# Patient Record
Sex: Male | Born: 1980 | Hispanic: Yes | Marital: Single | Smoking: Never smoker
Health system: Southern US, Community
[De-identification: ages and names within clinical notes are randomized; demographics above are authoritative.]

## PROBLEM LIST (undated history)

## (undated) HISTORY — PX: APPENDECTOMY: SHX54

---

## 2020-12-29 ENCOUNTER — Encounter: Payer: Self-pay | Admitting: Emergency Medicine

## 2020-12-29 ENCOUNTER — Other Ambulatory Visit: Payer: Self-pay

## 2020-12-29 ENCOUNTER — Emergency Department
Admission: EM | Admit: 2020-12-29 | Discharge: 2020-12-30 | Disposition: A | Discharge status: Home / Self Care | Payer: Self-pay | Attending: Emergency Medicine | Admitting: Emergency Medicine

## 2020-12-29 DIAGNOSIS — H66012 Acute suppurative otitis media with spontaneous rupture of ear drum, left ear: Secondary | ICD-10-CM | POA: Insufficient documentation

## 2020-12-29 MED ORDER — AMOXICILLIN 875 MG PO TABS: 875.0000 mg | ORAL_TABLET | Freq: Two times a day (BID) | ORAL | 0 refills | Status: AC

## 2020-12-29 MED ORDER — OFLOXACIN 0.3 % OT SOLN: 5.0000 [drp] | Freq: Every day | OTIC | 0 refills | Status: AC

## 2020-12-29 NOTE — Discharge Instructions (Addendum)
You can take amoxicillin twice daily for the next 7 days. You can take 5 drops of ofloxacin twice daily for the next 7 days.

## 2020-12-29 NOTE — ED Provider Notes (Signed)
[facility]-EMERGENCY DEPARTMENT  ____________________________________________  Time seen: Approximately 10:34 PM  I have reviewed the triage vital signs and the nursing notes.   HISTORY  Chief Complaint Otalgia   Historian Patient     HPI Mark Savage is a 40 y.o. male presents to the emergency department with left ear pain and bloody discharge from the left ear.  He reports that hearing has been muffled for the past several weeks.  No alleviating measures have been attempted.   History reviewed. No pertinent past medical history.   Immunizations up to date:  Yes.     History reviewed. No pertinent past medical history.  There are no problems to display for this patient.   Past Surgical History:  Procedure Laterality Date   APPENDECTOMY      Prior to Admission medications   Medication Sig Start Date End Date Taking? Authorizing Provider  amoxicillin (AMOXIL) 875 MG tablet Take 1 tablet (875 mg total) by mouth 2 (two) times daily for 10 days. 12/29/20 01/08/21 Yes Pia Mau M, PA-C  ofloxacin (FLOXIN) 0.3 % OTIC solution Place 5 drops into both ears daily for 7 days. 12/29/20 01/05/21 Yes Orvil Feil, PA-C    Allergies Patient has no known allergies.  No family history on file.  Social History Social History   Tobacco Use   Smoking status: Never   Smokeless tobacco: Never  Vaping Use   Vaping Use: Never used  Substance Use Topics   Alcohol use: Yes     Review of Systems  Constitutional: No fever/chills Eyes:  No discharge ENT: Patient has left sided ear pain.  Respiratory: no cough. No SOB/ use of accessory muscles to breath Gastrointestinal:   No nausea, no vomiting.  No diarrhea.  No constipation. Musculoskeletal: Negative for musculoskeletal pain. Skin: Negative for rash, abrasions, lacerations, ecchymosis. ____________________________________________   PHYSICAL EXAM:  VITAL SIGNS: ED Triage Vitals  Enc Vitals Group     BP  12/29/20 1912 (!) 163/103     Pulse Rate 12/29/20 1912 63     Resp 12/29/20 1912 16     Temp 12/29/20 1912 98.8 F (37.1 C)     Temp Source 12/29/20 1912 Oral     SpO2 12/29/20 1912 98 %     Weight 12/29/20 1920 190 lb (86.2 kg)     Height 12/29/20 1920 5\' 5"  (1.651 m)     Head Circumference --      Peak Flow --      Pain Score 12/29/20 1919 10     Pain Loc --      Pain Edu? --      Excl. in GC? --      Constitutional: Alert and oriented. Well appearing and in no acute distress. Eyes: Conjunctivae are normal. PERRL. EOMI. Head: Atraumatic. ENT:      Ears: Patient's left TM has evidence of otitis media with perforation.      Nose: No congestion/rhinnorhea.      Mouth/Throat: Mucous membranes are moist.  Neck: No stridor.  No cervical spine tenderness to palpation. Cardiovascular: Normal rate, regular rhythm. Normal S1 and S2.  Good peripheral circulation. Respiratory: Normal respiratory effort without tachypnea or retractions. Lungs CTAB. Good air entry to the bases with no decreased or absent breath sounds Gastrointestinal: Bowel sounds x 4 quadrants. Soft and nontender to palpation. No guarding or rigidity. No distention. Musculoskeletal: Full range of motion to all extremities. No obvious deformities noted Neurologic:  Normal for age. No gross  focal neurologic deficits are appreciated.  Skin:  Skin is warm, dry and intact. No rash noted. Psychiatric: Mood and affect are normal for age. Speech and behavior are normal.   ____________________________________________   LABS (all labs ordered are listed, but only abnormal results are displayed)  Labs Reviewed - No data to display ____________________________________________  EKG   ____________________________________________  RADIOLOGY   No results found.  ____________________________________________    PROCEDURES  Procedure(s) performed:     Procedures     Medications - No data to  display   ____________________________________________   INITIAL IMPRESSION / ASSESSMENT AND PLAN / ED COURSE  Pertinent labs & imaging results that were available during my care of the patient were reviewed by me and considered in my medical decision making (see chart for details).      Assessment and plan Otitis media with perforation 40 year old male presents to the emergency department with left ear pain and bloody purulent discharge.  On exam, patient had perforated TM with evidence of otitis media.  Patient was discharged with amoxicillin and ofloxacin.  Tylenol was recommended for discomfort.  All patient questions were answered.     ____________________________________________  FINAL CLINICAL IMPRESSION(S) / ED DIAGNOSES  Final diagnoses:  Acute suppurative otitis media of left ear with spontaneous rupture of tympanic membrane, recurrence not specified      NEW MEDICATIONS STARTED DURING THIS VISIT:  ED Discharge Orders          Ordered    amoxicillin (AMOXIL) 875 MG tablet  2 times daily        12/29/20 2231    ofloxacin (FLOXIN) 0.3 % OTIC solution  Daily        12/29/20 2231                This chart was dictated using voice recognition software/Dragon. Despite best efforts to proofread, errors can occur which can change the meaning. Any change was purely unintentional.     Orvil Feil, PA-C 12/29/20 2236    Sharyn Creamer, MD 12/30/20 [id number]

## 2020-12-29 NOTE — ED Notes (Addendum)
Pt states that 3 months ago after return from the beach he cleaned his L ear with a qtip and started having difficulty hearing afterwards. Today, he started having blood from his L ear. Pt deneis pain, dizziness/n/v/fever. Pt has instilled hydrogen peroxide into ear after having hearing loss, after the qtip in his ear, once because he felt his ear pulsating. Pt has not sought any medical attention in the [place] Pt states he saw a doctor in [place]. Pt said the doctor told him not to bath in the ocean or river because "something burst in the ear".

## 2020-12-29 NOTE — ED Triage Notes (Signed)
Triage Assessment per SPanish interpreter  Pt arrived via POV with reports of L ear pain and decreased hearing x 2 months with bleeding noted today. Pt states he feels like there is something in his ear.

## 2021-03-07 ENCOUNTER — Emergency Department: Payer: Self-pay

## 2021-03-07 ENCOUNTER — Other Ambulatory Visit: Payer: Self-pay

## 2021-03-07 ENCOUNTER — Emergency Department
Admission: EM | Admit: 2021-03-07 | Discharge: 2021-03-07 | Disposition: A | Discharge status: Home / Self Care | Payer: Self-pay | Attending: Emergency Medicine | Admitting: Emergency Medicine

## 2021-03-07 DIAGNOSIS — R519 Headache, unspecified: Secondary | ICD-10-CM | POA: Insufficient documentation

## 2021-03-07 MED ORDER — BUTALBITAL-APAP-CAFFEINE 50-325-40 MG PO TABS: 1.0000 | ORAL_TABLET | Freq: Four times a day (QID) | ORAL | 0 refills | Status: AC | PRN

## 2021-03-07 MED ORDER — KETOROLAC TROMETHAMINE 30 MG/ML IJ SOLN
30.0000 mg | Freq: Once | INTRAMUSCULAR | Status: AC
  Administered 2021-03-07: 30 mg via INTRAMUSCULAR
  Filled 2021-03-07: qty 1

## 2021-03-07 NOTE — ED Notes (Signed)
Spanish interpreter on video.

## 2021-03-07 NOTE — ED Provider Notes (Signed)
[facility] Emergency Department Provider Note   ____________________________________________    I have reviewed the triage vital signs and the nursing notes.   HISTORY  Chief Complaint Headache     HPI Mark Savage is a 39 y.o. male who presents with complaints of headache.  Patient reports 3 weeks of headache typically in the morning.  He describes waking up with throbbing pain in his posterior head, then extends anteriorly bilaterally.  Denies sinus pressure.  No fevers or chills.  No neck pain.  No neurodeficits.  No history of the same.  Has taken ibuprofen for this  History reviewed. No pertinent past medical history.  There are no problems to display for this patient.   Past Surgical History:  Procedure Laterality Date   APPENDECTOMY      Prior to Admission medications   Medication Sig Start Date End Date Taking? Authorizing Provider  butalbital-acetaminophen-caffeine (FIORICET) 50-325-40 MG tablet Take 1-2 tablets by mouth every 6 (six) hours as needed for headache. 03/07/21 03/07/22 Yes Jene Every, MD     Allergies Patient has no known allergies.  No family history on file.  Social History Social History   Tobacco Use   Smoking status: Never   Smokeless tobacco: Never  Vaping Use   Vaping Use: Never used  Substance Use Topics   Alcohol use: Yes    Review of Systems  Constitutional: No fever/chills  ENT: No sore throat.   Gastrointestinal: No abdominal pain.  No nausea, no vomiting.   Genitourinary: Negative for dysuria. Musculoskeletal: Negative for back pain. Skin: Negative for rash. Neurological: As above    ____________________________________________   PHYSICAL EXAM:  VITAL SIGNS: ED Triage Vitals [03/07/21 0909]  Enc Vitals Group     BP (!) 161/91     Pulse Rate 65     Resp 18     Temp 98.3 F (36.8 C)     Temp Source Oral     SpO2 100 %     Weight 90.7 kg (200 lb)      Height 1.676 m (5\' 6" )     Head Circumference      Peak Flow      Pain Score 10     Pain Loc      Pain Edu?      Excl. in GC?     Constitutional: Alert and oriented. No acute distress. Pleasant and interactive Eyes: Conjunctivae are normal.  Head: Atraumatic. Nose: No congestion/rhinnorhea. Mouth/Throat: Mucous membranes are moist.   Cardiovascular: Normal rate, regular rhythm.  Respiratory: Normal respiratory effort.  No retractions. Genitourinary: deferred Musculoskeletal: No lower extremity tenderness nor edema.   Neurologic:  Normal speech and language. No gross focal neurologic deficits are appreciated.   Skin:  Skin is warm, dry and intact. No rash noted.   ____________________________________________   LABS (all labs ordered are listed, but only abnormal results are displayed)  Labs Reviewed - No data to display ____________________________________________  EKG   ____________________________________________  RADIOLOGY  CT head reviewed by me, no acute abnormality ____________________________________________   PROCEDURES  Procedure(s) performed: No  Procedures   Critical Care performed: No ____________________________________________   INITIAL IMPRESSION / ASSESSMENT AND PLAN / ED COURSE  Pertinent labs & imaging results that were available during my care of the patient were reviewed by me and considered in my medical decision making (see chart for details).   Patient presents with 3 weeks of morning headaches as described above.  We will send for imaging to rule out mass-effect.  CT is reassuring, patient feeling better after IM Toradol.  We will start Fioricet recommend the patient follow-up with neurology in [facility] where he is from.   ____________________________________________   FINAL CLINICAL IMPRESSION(S) / ED DIAGNOSES  Final diagnoses:  Acute nonintractable headache, unspecified headache type      NEW MEDICATIONS STARTED  DURING THIS VISIT:  Discharge Medication List as of 03/07/2021 11:17 AM     START taking these medications   Details  butalbital-acetaminophen-caffeine (FIORICET) 50-325-40 MG tablet Take 1-2 tablets by mouth every 6 (six) hours as needed for headache., Starting Sun 03/07/2021, Until Mon 03/07/2022 at 2359, Normal         Note:  This document was prepared using Dragon voice recognition software and may include unintentional dictation errors.    Jene Every, MD 03/07/21 1444

## 2021-03-07 NOTE — ED Notes (Signed)
Patient taken to CT scan.

## 2021-03-07 NOTE — ED Triage Notes (Signed)
Pt c/o headache to the back of his head that comes up around the front for the past 3 weeks, states he has been taking advil with no relief, last took 600mg  around 4am today, denies nausea or visual changes,

## 2021-04-10 ENCOUNTER — Encounter (HOSPITAL_BASED_OUTPATIENT_CLINIC_OR_DEPARTMENT_OTHER): Payer: Self-pay | Admitting: *Deleted

## 2021-04-10 ENCOUNTER — Other Ambulatory Visit: Payer: Self-pay

## 2021-04-10 ENCOUNTER — Emergency Department (HOSPITAL_BASED_OUTPATIENT_CLINIC_OR_DEPARTMENT_OTHER)
Admission: EM | Admit: 2021-04-10 | Discharge: 2021-04-10 | Disposition: A | Discharge status: Home / Self Care | Payer: Self-pay | Attending: Emergency Medicine | Admitting: Emergency Medicine

## 2021-04-10 DIAGNOSIS — H66005 Acute suppurative otitis media without spontaneous rupture of ear drum, recurrent, left ear: Secondary | ICD-10-CM | POA: Insufficient documentation

## 2021-04-10 DIAGNOSIS — G43009 Migraine without aura, not intractable, without status migrainosus: Secondary | ICD-10-CM | POA: Insufficient documentation

## 2021-04-10 MED ORDER — DIPHENHYDRAMINE HCL 50 MG/ML IJ SOLN
12.5000 mg | Freq: Once | INTRAMUSCULAR | Status: AC
  Administered 2021-04-10: 12.5 mg via INTRAVENOUS
  Filled 2021-04-10: qty 1

## 2021-04-10 MED ORDER — SODIUM CHLORIDE 0.9 % IV BOLUS
500.0000 mL | Freq: Once | INTRAVENOUS | Status: AC
  Administered 2021-04-10: 500 mL via INTRAVENOUS

## 2021-04-10 MED ORDER — KETOROLAC TROMETHAMINE 15 MG/ML IJ SOLN
15.0000 mg | Freq: Once | INTRAMUSCULAR | Status: AC
  Administered 2021-04-10: 15 mg via INTRAVENOUS
  Filled 2021-04-10: qty 1

## 2021-04-10 MED ORDER — AMOXICILLIN-POT CLAVULANATE 875-125 MG PO TABS: 1.0000 | ORAL_TABLET | Freq: Two times a day (BID) | ORAL | 0 refills | Status: DC

## 2021-04-10 NOTE — Discharge Instructions (Addendum)
Lo vieron en el departamento de emergencias por dolor de odo.   Como comentamos, tiene una infeccin en el odo izquierdo para la que le estoy recetando antibiticos. Debe tomar [employer] al da durante una semana. Creo que su dolor de [place] y mareos mejorarn cuando esto sea tratado. Puede tomar ibuprofeno o tylenol para el dolor.   Regrese al departamento de emergencias por fiebre, dificultad para tragar o [employer].

## 2021-04-10 NOTE — ED Provider Notes (Signed)
[facility] EMERGENCY DEPARTMENT Provider Note   CSN: [id number] Arrival date & time: 04/10/21  1947     History Chief Complaint  Patient presents with   Otalgia    Mark Savage is a 40 y.o. male with history of migraines who presents to the emergency department complaining of headache for several weeks, and left ear pain.  Patient states that he has had bloody drainage from his left ear, and this happened to him before when he had an ear infection.  He has been taking migraine medication daily without relief.  He denies fevers, chills, sore throat, changes in his vision.  Spanish interpreter used during interview.   Otalgia Associated symptoms: headaches   Associated symptoms: no abdominal pain, no congestion, no fever, no sore throat and no vomiting       History reviewed. No pertinent past medical history.  There are no problems to display for this patient.   Past Surgical History:  Procedure Laterality Date   APPENDECTOMY         No family history on file.  Social History   Tobacco Use   Smoking status: Never   Smokeless tobacco: Never  Vaping Use   Vaping Use: Never used  Substance Use Topics   Alcohol use: Yes   Drug use: Never    Home Medications Prior to Admission medications   Medication Sig Start Date End Date Taking? Authorizing Provider  amoxicillin-clavulanate (AUGMENTIN) 875-125 MG tablet Take 1 tablet by mouth every 12 (twelve) hours. 04/10/21  Yes Ayslin Kundert T, PA-C  butalbital-acetaminophen-caffeine (FIORICET) 50-325-40 MG tablet Take 1-2 tablets by mouth every 6 (six) hours as needed for headache. 03/07/21 03/07/22  Jene Every, MD    Allergies    Patient has no known allergies.  Review of Systems   Review of Systems  Constitutional:  Negative for chills and fever.  HENT:  Positive for ear pain. Negative for congestion, sore throat and trouble swallowing.   Respiratory:  Negative for shortness of  breath.   Cardiovascular:  Negative for chest pain.  Gastrointestinal:  Negative for abdominal pain, nausea and vomiting.  Neurological:  Positive for dizziness and headaches. Negative for syncope and numbness.  All other systems reviewed and are negative.  Physical Exam Updated Vital Signs BP (!) 143/88 (BP Location: Left Arm)    Pulse 65    Temp 98.4 F (36.9 C) (Oral)    Resp 16    Ht 5\' 6"  (1.676 m)    Wt 88 kg    SpO2 99%    BMI 31.31 kg/m   Physical Exam Vitals and nursing note reviewed.  Constitutional:      Appearance: Normal appearance.  HENT:     Head: Normocephalic and atraumatic.     Right Ear: Tympanic membrane, ear canal and external ear normal.     Left Ear: Ear canal and external ear normal. Tenderness present. No mastoid tenderness. Tympanic membrane is erythematous and bulging. Tympanic membrane is not perforated.     Ears:     Comments: Bulging TM with purulent effusion Eyes:     Conjunctiva/sclera: Conjunctivae normal.  Cardiovascular:     Rate and Rhythm: Normal rate and regular rhythm.  Pulmonary:     Effort: Pulmonary effort is normal. No respiratory distress.     Breath sounds: Normal breath sounds.  Abdominal:     General: There is no distension.     Palpations: Abdomen is soft.     Tenderness: There  is no abdominal tenderness.  Skin:    General: Skin is warm and dry.  Neurological:     General: No focal deficit present.     Mental Status: He is alert.     Comments: Neuro: Speech is clear, able to follow commands. CN III-XII intact grossly intact. PERRLA. EOMI. Sensation intact throughout. Str 5/5 all extremities.     ED Results / Procedures / Treatments   Labs (all labs ordered are listed, but only abnormal results are displayed) Labs Reviewed - No data to display  EKG None  Radiology No results found.  Procedures Procedures   Medications Ordered in ED Medications  sodium chloride 0.9 % bolus 500 mL (500 mLs Intravenous New Bag/Given  04/10/21 2033)  ketorolac (TORADOL) 15 MG/ML injection 15 mg (15 mg Intravenous Given 04/10/21 2034)  diphenhydrAMINE (BENADRYL) injection 12.5 mg (12.5 mg Intravenous Given 04/10/21 2033)    ED Course  I have reviewed the triage vital signs and the nursing notes.  Pertinent labs & imaging results that were available during my care of the patient were reviewed by me and considered in my medical decision making (see chart for details).    MDM Rules/Calculators/A&P                          Patient is a 40 year old male with history of migraines who presents to the emergency department for ear pain and headache.  History of recurrent ear infection.  On my exam patient is afebrile, not tachycardic, not hypoxic, no acute distress.  He has a bulging left TM with purulent effusion, no perforation. Neurologic exam grossly normal as above.  Patient states headache improved with migraine cocktail.  He is not requiring admission or inpatient treatment. Plan to discharge home with antibiotics, given close return precautions.  Patient agreeable to plan.  Final Clinical Impression(s) / ED Diagnoses Final diagnoses:  Recurrent acute suppurative otitis media without spontaneous rupture of left tympanic membrane  Migraine without aura and without status migrainosus, not intractable    Rx / DC Orders ED Discharge Orders          Ordered    amoxicillin-clavulanate (AUGMENTIN) 875-125 MG tablet  Every 12 hours        04/10/21 2140           Portions of this report may have been transcribed using voice recognition software. Every effort was made to ensure accuracy; however, inadvertent computerized transcription errors may be present.    Su Monks, PA-C 04/10/21 2142    Melene Plan, DO 04/10/21 2236

## 2021-04-10 NOTE — ED Triage Notes (Signed)
Pt reports left ear pain and pain in back of head x 2 months

## 2021-06-05 ENCOUNTER — Other Ambulatory Visit: Payer: Self-pay

## 2021-06-05 ENCOUNTER — Emergency Department (HOSPITAL_BASED_OUTPATIENT_CLINIC_OR_DEPARTMENT_OTHER)
Admission: EM | Admit: 2021-06-05 | Discharge: 2021-06-05 | Disposition: A | Discharge status: Home / Self Care | Payer: Self-pay | Attending: Emergency Medicine | Admitting: Emergency Medicine

## 2021-06-05 ENCOUNTER — Encounter (HOSPITAL_BASED_OUTPATIENT_CLINIC_OR_DEPARTMENT_OTHER): Payer: Self-pay | Admitting: Emergency Medicine

## 2021-06-05 DIAGNOSIS — H66015 Acute suppurative otitis media with spontaneous rupture of ear drum, recurrent, left ear: Secondary | ICD-10-CM | POA: Insufficient documentation

## 2021-06-05 DIAGNOSIS — H7292 Unspecified perforation of tympanic membrane, left ear: Secondary | ICD-10-CM

## 2021-06-05 MED ORDER — AMOXICILLIN-POT CLAVULANATE 875-125 MG PO TABS: 1.0000 | ORAL_TABLET | Freq: Two times a day (BID) | ORAL | 0 refills | Status: DC

## 2021-06-05 NOTE — ED Notes (Signed)
Discharge instructions including prescription and follow up care discussed with pt. Pt verbalized understanding with no questions at this time.  

## 2021-06-05 NOTE — ED Triage Notes (Signed)
Reports pain in left ear for the last few months and now having drainage.

## 2021-06-05 NOTE — ED Notes (Signed)
ED Provider at bedside. 

## 2021-06-05 NOTE — ED Provider Notes (Signed)
[facility] EMERGENCY DEPARTMENT Provider Note   CSN: [id number] Arrival date & time: 06/05/21  1911     History  Chief Complaint  Patient presents with   Otalgia    Mark Savage is a 41 y.o. male.  Pt is a well appearing 41 y/o male with hx of left ear pain and otitis who has returned due to chronic drianage from the ear but now 4 days of pain.  Pt reports for the last 3 months almost every day he will have a small amount of drainage from his left ear but feels his hearing is fine.  However now there is getting to be more pain in the ear in the last 4 days.  No pain behind the ear or swelling of the ear.  He denies any trauma but reports when he was a kid he would swim a lot and one time remembers perforating his ear drum.  He has been seen a few times in the ER and reports getting some pills that helped with the pain but the drainage never resolved.   The history is provided by the patient and medical records.  Otalgia Location:  Left     Home Medications Prior to Admission medications   Medication Sig Start Date End Date Taking? Authorizing Provider  amoxicillin-clavulanate (AUGMENTIN) 875-125 MG tablet Take 1 tablet by mouth every 12 (twelve) hours. 06/05/21  Yes Margarita Croke, Alphonzo Lemmings, MD  butalbital-acetaminophen-caffeine (FIORICET) 50-325-40 MG tablet Take 1-2 tablets by mouth every 6 (six) hours as needed for headache. 03/07/21 03/07/22  Jene Every, MD      Allergies    Patient has no known allergies.    Review of Systems   Review of Systems  HENT:  Positive for ear pain.    Physical Exam Updated Vital Signs BP (!) 150/104 (BP Location: Right Arm)    Pulse (!) 59    Temp 97.9 F (36.6 C) (Oral)    Resp 18    Ht 5\' 6"  (1.676 m)    Wt 88.3 kg    SpO2 100%    BMI 31.41 kg/m  Physical Exam Vitals and nursing note reviewed.  Constitutional:      Appearance: Normal appearance. He is normal weight.  HENT:     Head: Normocephalic and  atraumatic.     Right Ear: There is impacted cerumen.     Left Ear: No swelling. There is no impacted cerumen. No mastoid tenderness. Tympanic membrane is injected and perforated.     Ears:     Comments: Large central perforation of the TM with surrounding erythema and minimal clear drainage.  Normal canal.  No tenderness with movement of the pinna. Neurological:     Mental Status: He is alert.    ED Results / Procedures / Treatments   Labs (all labs ordered are listed, but only abnormal results are displayed) Labs Reviewed - No data to display  EKG None  Radiology No results found.  Procedures Procedures    Medications Ordered in ED Medications - No data to display  ED Course/ Medical Decision Making/ A&P                           Medical Decision Making Risk Prescription drug management.   Pt presenting with left ear pain.  Has had chronic drainage and today noted to have a perforated TM which I suspect has been chronic based on hx.  He has no  signs of mastoiditis and is well appearing.  Suspect early otitis given now pt has had pain and has some erythema and heavier drainage per pt.  Given augmentin but discussed with pt the importance of ENT f/u.  Information given as pt may need a patch to fix in the future.        Final Clinical Impression(s) / ED Diagnoses Final diagnoses:  Recurrent acute suppurative otitis media with spontaneous rupture of left tympanic membrane  Perforation of left tympanic membrane    Rx / DC Orders ED Discharge Orders          Ordered    amoxicillin-clavulanate (AUGMENTIN) 875-125 MG tablet  Every 12 hours        06/05/21 1931              Gwyneth Sprout, MD 06/05/21 1938

## 2022-07-22 ENCOUNTER — Other Ambulatory Visit: Payer: Self-pay

## 2022-07-22 ENCOUNTER — Emergency Department (HOSPITAL_BASED_OUTPATIENT_CLINIC_OR_DEPARTMENT_OTHER)
Admission: EM | Admit: 2022-07-22 | Discharge: 2022-07-22 | Disposition: A | Discharge status: Home / Self Care | Payer: Self-pay | Attending: Emergency Medicine | Admitting: Emergency Medicine

## 2022-07-22 DIAGNOSIS — R519 Headache, unspecified: Secondary | ICD-10-CM | POA: Insufficient documentation

## 2022-07-22 DIAGNOSIS — H9192 Unspecified hearing loss, left ear: Secondary | ICD-10-CM | POA: Insufficient documentation

## 2022-07-22 DIAGNOSIS — H9202 Otalgia, left ear: Secondary | ICD-10-CM | POA: Insufficient documentation

## 2022-07-22 MED ORDER — OFLOXACIN 0.3 % OP SOLN
5.0000 [drp] | Freq: Every day | OPHTHALMIC | Status: DC
  Administered 2022-07-22: 5 [drp] via OTIC
  Filled 2022-07-22: qty 5

## 2022-07-22 NOTE — ED Triage Notes (Signed)
Patient coming to ED for evaluation of L ear pain.  Reports decreased hearing and pain that radiates into head.  No reports of fever.  States symptoms started last week.

## 2022-07-22 NOTE — Discharge Instructions (Signed)
Please read and follow all provided instructions.  Your diagnoses today include:  1. Left ear pain   2. Decreased hearing of left ear   3. Acute nonintractable headache, unspecified headache type     Tests performed today include: Vital signs. See below for your results today.   Medications prescribed:  Ofloxacin (ear drops) - Use 5 drops in left ear twice a day for 7 days  Home care instructions:  Follow any educational materials contained in this packet.  Follow-up instructions: Call the ear doctor listed on Monday to schedule a follow-up appointment if your symptoms are not resolved.  Return instructions:  Please return to the Emergency Department if you experience worsening symptoms.  Please return if you have any other emergent concerns.  Additional Information:  Your vital signs today were: BP (!) 156/96 (BP Location: Left Arm)   Pulse 66   Temp 98.4 F (36.9 C) (Oral)   Resp 18   Ht 5\' 6"  (1.676 m)   Wt 88.5 kg   SpO2 99%   BMI 31.47 kg/m  If your blood pressure (BP) was elevated above 135/85 this visit, please have this repeated by your doctor within one month. ---------------

## 2022-07-22 NOTE — ED Provider Notes (Signed)
[place] [facility] Provider Note   CSN: [id number] Arrival date & time: 07/22/22  2011     History  Chief Complaint  Patient presents with   Otalgia   Headache    Mark Savage is a 42 y.o. male.  Patient presents for decreased hearing out of left ear as well as left ear pain and headache.  He has had headache pain for several days but the loss of hearing occurred yesterday.  Spanish interpreter utilized.  Patient has a history of an episode remotely while living in [place] where he states that he had a "popping" sensation in his ear followed by bleeding from his ear.  He states that he was given a shot which stopped the bleeding.  Otherwise no history regarding the ear.  No recent trauma.  Patient was at his job, moving furniture, yesterday when the symptoms began.  No fevers.  He has had a mild sore throat and mild nasal congestion.  Denies putting any foreign bodies into the ear canal.  Denies being hit on the head.  No current drainage or bleeding.       Home Medications Prior to Admission medications   Medication Sig Start Date End Date Taking? Authorizing Provider  amoxicillin-clavulanate (AUGMENTIN) 875-125 MG tablet Take 1 tablet by mouth every 12 (twelve) hours. 06/05/21   Blanchie Dessert, MD      Allergies    Patient has no known allergies.    Review of Systems   Review of Systems  Physical Exam Updated Vital Signs BP 134/86   Pulse 65   Temp 98.4 F (36.9 C) (Oral)   Resp 16   Ht 5\' 6"  (1.676 m)   Wt 88.5 kg   SpO2 97%   BMI 31.47 kg/m  Physical Exam Vitals and nursing note reviewed.  Constitutional:      Appearance: He is well-developed.  HENT:     Head: Normocephalic and atraumatic.     Jaw: No trismus.     Right Ear: There is impacted cerumen.     Left Ear: Decreased hearing noted. Swelling present. Tympanic membrane is not injected or erythematous.     Ears:     Comments: Left ear canal is  mildly edematous and there is some debris.  TM appears abnormal but not erythematous or bulging.  It is difficult for me to tell if there is a perforation.  I do not see typical landmarks.  Right ear with cerumen impaction but patient is asymptomatic on this side.    Nose: Nose normal. No mucosal edema or rhinorrhea.     Mouth/Throat:     Mouth: Mucous membranes are moist. Mucous membranes are not dry.     Pharynx: Uvula midline. No oropharyngeal exudate, posterior oropharyngeal erythema or uvula swelling.     Tonsils: No tonsillar abscesses.  Eyes:     General:        Right eye: No discharge.        Left eye: No discharge.     Conjunctiva/sclera: Conjunctivae normal.  Cardiovascular:     Rate and Rhythm: Normal rate and regular rhythm.     Heart sounds: Normal heart sounds.  Pulmonary:     Effort: Pulmonary effort is normal. No respiratory distress.     Breath sounds: Normal breath sounds. No wheezing or rales.  Abdominal:     Palpations: Abdomen is soft.     Tenderness: There is no abdominal tenderness.  Musculoskeletal:  Cervical back: Normal range of motion and neck supple.  Skin:    General: Skin is warm and dry.  Neurological:     Mental Status: He is alert.     ED Results / Procedures / Treatments   Labs (all labs ordered are listed, but only abnormal results are displayed) Labs Reviewed - No data to display  EKG None  Radiology No results found.  Procedures Procedures    Medications Ordered in ED Medications  ofloxacin (OCUFLOX) 0.3 % ophthalmic solution 5 drop (5 drops Left EAR Given 07/22/22 2113)    ED Course/ Medical Decision Making/ A&P   {    Patient seen and examined. History obtained directly from patient using Spanish video interpreter.  Labs/EKG: None ordered  Imaging: None ordered  Medications/Fluids: Ordered: Ofloxacin drops  Most recent vital signs reviewed and are as follows: BP 134/86   Pulse 65   Temp 98.4 F (36.9 C) (Oral)    Resp 16   Ht 5\' 6"  (1.676 m)   Wt 88.5 kg   SpO2 97%   BMI 31.47 kg/m   Initial impression: Ear pain and decreased hearing  Home treatment plan: Patient will trial eardrops over the weekend to see if this helps.  Return instructions discussed with patient: Worsening symptoms, fever, redness or swelling around the ear, other concerns.  Follow-up instructions discussed with patient: If symptoms are not improved in 3 days, patient is given ENT referral so that he can be further evaluated.                            Medical Decision Making Risk Prescription drug management.   Patient with decreased hearing in left ear as well as ear pain and headache.  No obvious otitis media.  TM appears abnormal but I do not distinctly see any trauma.  Ear canal exam suggestive of otitis externa but no active drainage.  No signs of malignant otitis externa. Treatment plan as above.  No indications for emergent ENT evaluation tonight.  In regards to the patient's headache, critical differentials were considered including subarachnoid hemorrhage, intracerebral hemorrhage, epidural/subdural hematoma, pituitary apoplexy, vertebral/carotid artery dissection, giant cell arteritis, central venous thrombosis, reversible cerebral vasoconstriction, acute angle closure glaucoma, idiopathic intracranial hypertension, bacterial meningitis, viral encephalitis, carbon monoxide poisoning, posterior reversible encephalopathy syndrome, pre-eclampsia.   Reg flag symptoms related to these causes were considered including systemic symptoms (fever, weight loss), neurologic symptoms (confusion, mental status change, vision change, associated seizure), acute or sudden "thunderclap" onset, patient age 43 or older with new or progressive headache, patient of any age with first headache or change in headache pattern, pregnant or postpartum status, history of HIV or other immunocompromise, history of cancer, headache occurring with  exertion, associated neck or shoulder pain, associated traumatic injury, concurrent use of anticoagulation, family history of spontaneous SAH, and concurrent drug use.    Other benign, more common causes of headache were considered including migraine, tension-type headache, cluster headache, referred pain from other cause such as sinus infection, dental pain, trigeminal neuralgia.          Final Clinical Impression(s) / ED Diagnoses Final diagnoses:  Left ear pain  Decreased hearing of left ear  Acute nonintractable headache, unspecified headache type    Rx / DC Orders ED Discharge Orders     None         Carlisle Cater, PA-C 07/22/22 2152    Leanord Asal K, DO 07/22/22 2302

## 2023-01-18 IMAGING — CT CT HEAD W/O CM
4 series · 16 of 47 positions shown, 18 images · non-contrast
Comparison: None.

CLINICAL DATA: Headaches

EXAM:
CT HEAD WITHOUT CONTRAST
TECHNIQUE: Contiguous axial images were obtained from the base of the skull
through the vertex without intravenous contrast.

[Series 2: head bone · axial · 0.40mm/px · z∈[-52,-24]mm · 3 of 73 slices shown]
[im 8/73  bone]
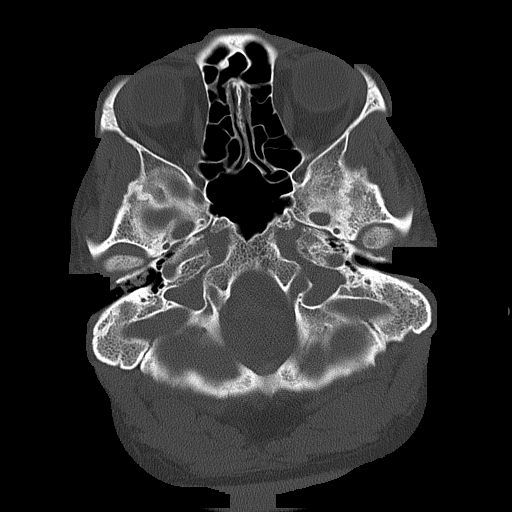
[im 15/73  bone]
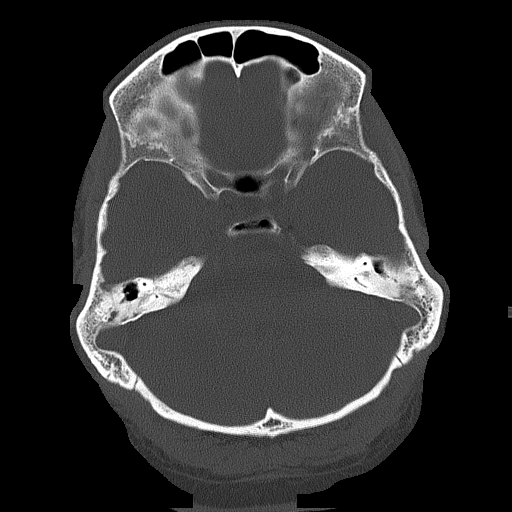
[im 22/73  bone]
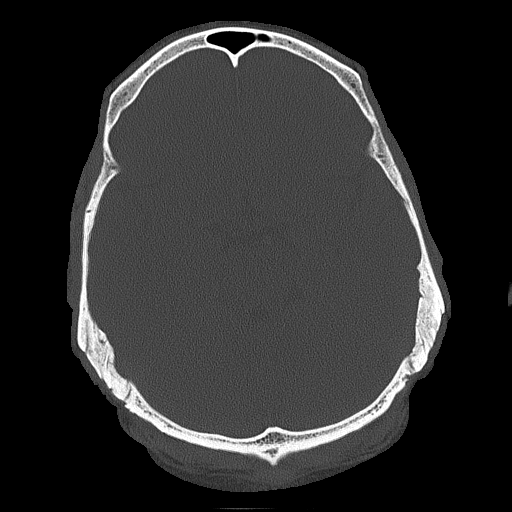

[Series 3: head wo · axial · 0.40mm/px · z∈[-51,+54]mm · 7 of 29 slices shown, 9 images]
[im 4/29  brain]
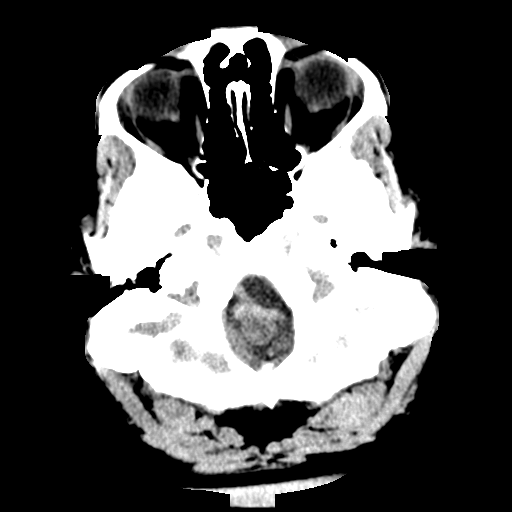
[im 4/29  bone]
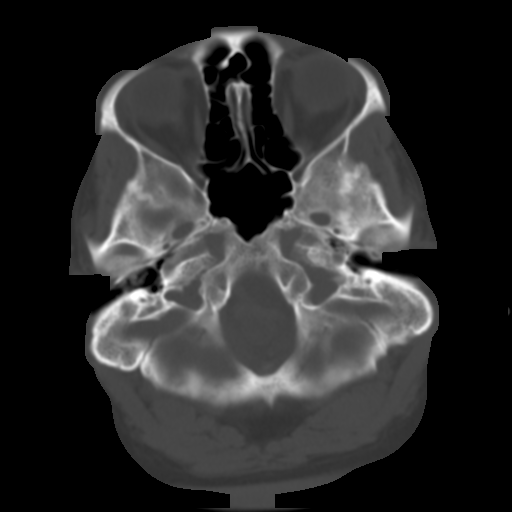
[im 8/29  brain]
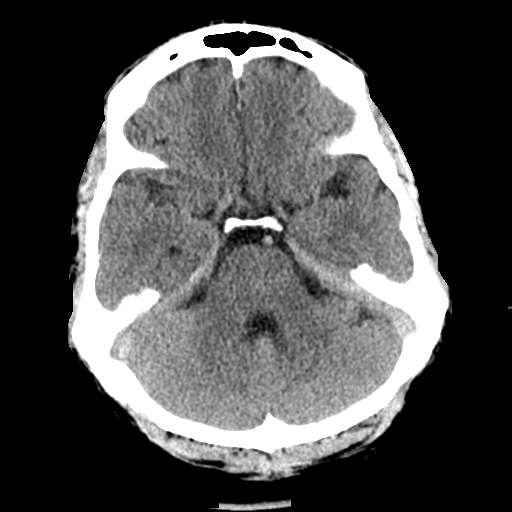
[im 11/29  brain]
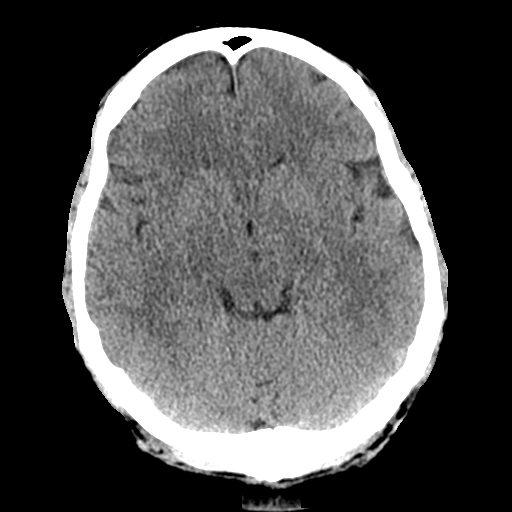
[im 15/29  brain]
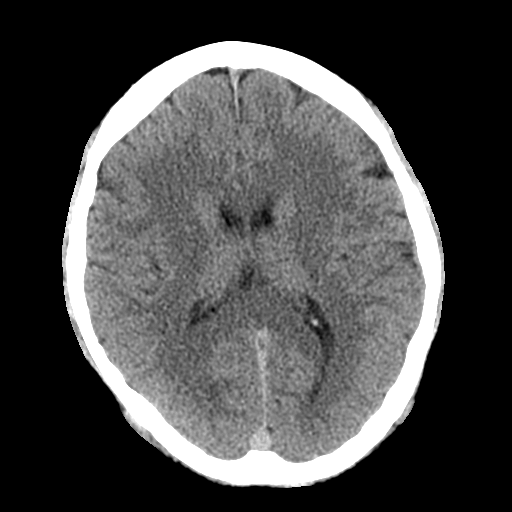
[im 18/29  brain]
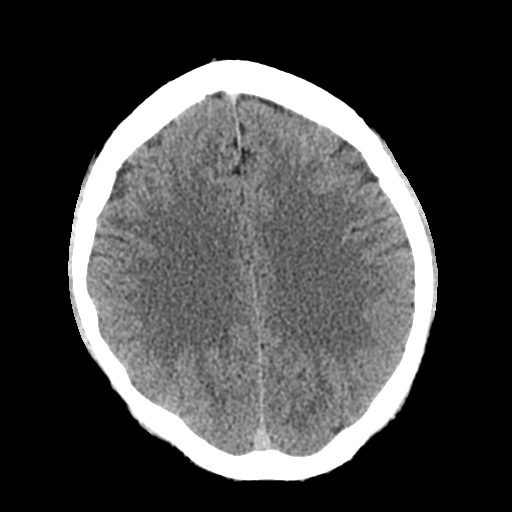
[im 18/29  bone]
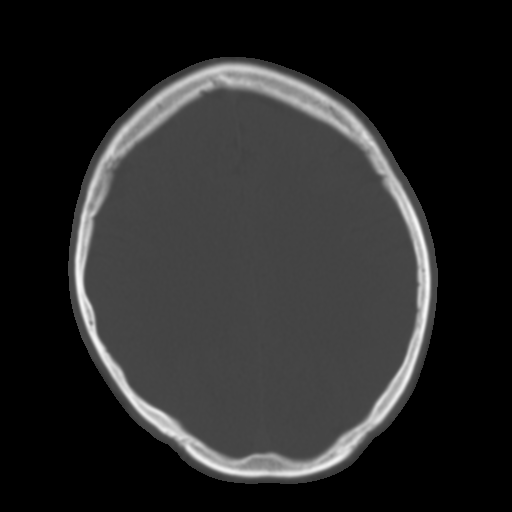
[im 22/29  brain]
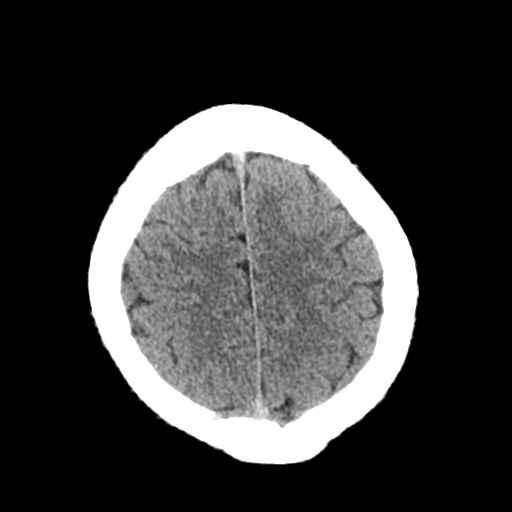
[im 25/29  brain]
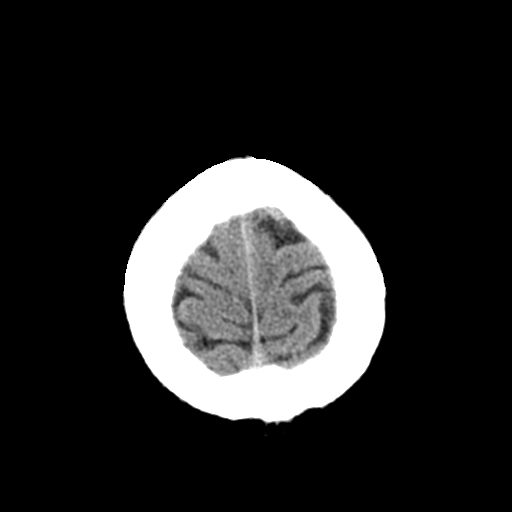

[Series 4: coronal soft tissue · coronal · 0.31mm/px · 3 of 67 slices shown]
[im 23/67  brain]
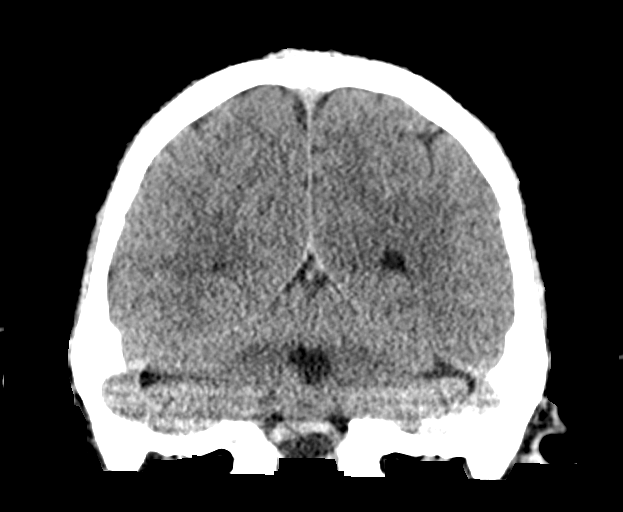
[im 30/67  brain]
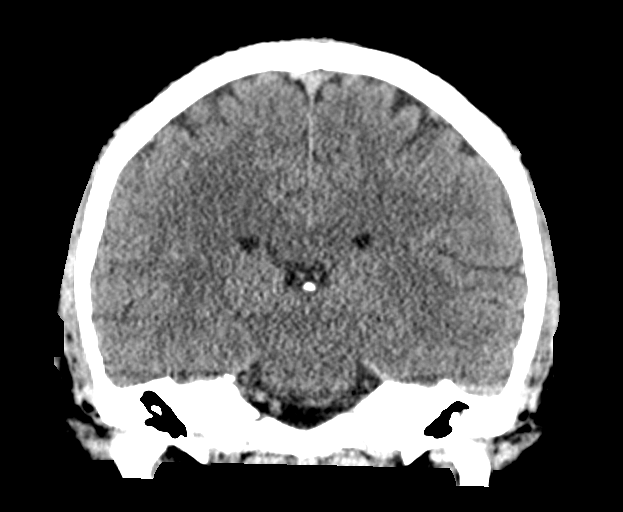
[im 37/67  brain]
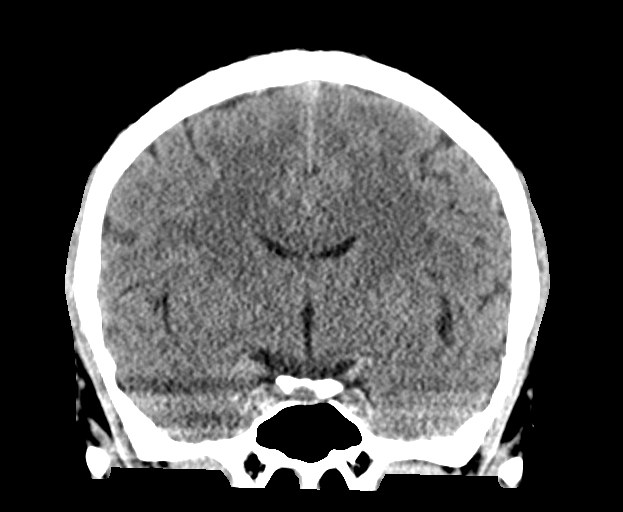

[Series 5: sagittal soft tissue · sagittal · 0.31mm/px · 3 of 58 slices shown]
[im 20/58  brain]
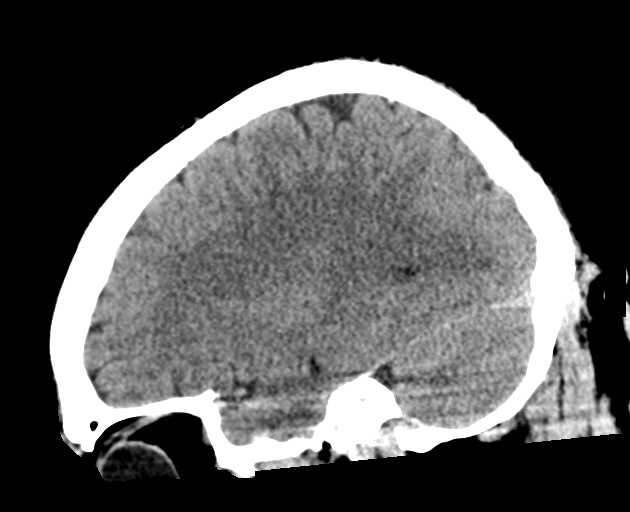
[im 29/58  brain]
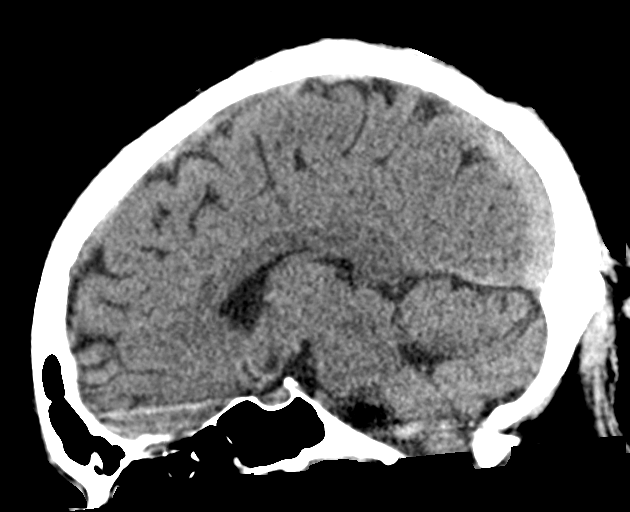
[im 39/58  brain]
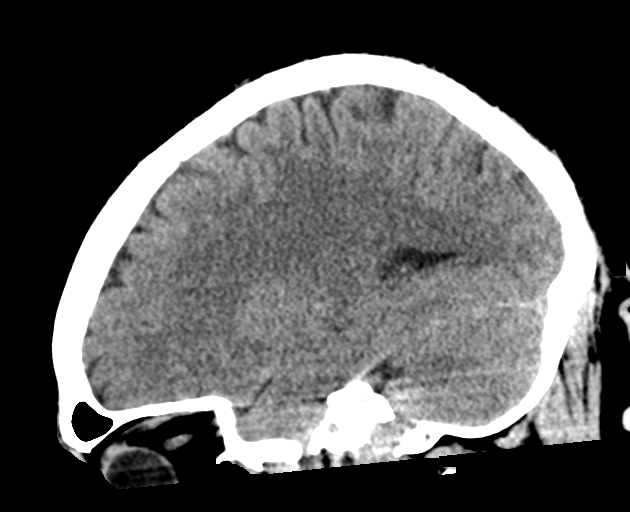

[16 of 47 positions shown; findings below may reference images not displayed]

FINDINGS: Brain: There are no signs of recent bleeding within the cranium.
There is no focal mass effect. Ventricles are not dilated. There are
no extra-axial fluid collections.

Vascular: Unremarkable.

Skull: Unremarkable.

Sinuses/Orbits: There are no air-fluid levels or significant mucosal
thickening in the visualized paranasal sinuses. There is decreased
number of air cells in mastoids on both sides. This may be a
congenital variation or suggest chronic mastoiditis.

Other: None
IMPRESSION: There are no signs of bleeding within the cranium. No acute
intracranial findings are seen.

## 2023-03-01 ENCOUNTER — Emergency Department (HOSPITAL_BASED_OUTPATIENT_CLINIC_OR_DEPARTMENT_OTHER)
Admission: EM | Admit: 2023-03-01 | Discharge: 2023-03-01 | Disposition: A | Discharge status: Home / Self Care | Payer: Self-pay | Attending: Emergency Medicine | Admitting: Emergency Medicine

## 2023-03-01 ENCOUNTER — Encounter (HOSPITAL_BASED_OUTPATIENT_CLINIC_OR_DEPARTMENT_OTHER): Payer: Self-pay | Admitting: Emergency Medicine

## 2023-03-01 ENCOUNTER — Other Ambulatory Visit: Payer: Self-pay

## 2023-03-01 DIAGNOSIS — M545 Low back pain, unspecified: Secondary | ICD-10-CM | POA: Insufficient documentation

## 2023-03-01 DIAGNOSIS — R519 Headache, unspecified: Secondary | ICD-10-CM | POA: Insufficient documentation

## 2023-03-01 DIAGNOSIS — M546 Pain in thoracic spine: Secondary | ICD-10-CM | POA: Insufficient documentation

## 2023-03-01 DIAGNOSIS — G8929 Other chronic pain: Secondary | ICD-10-CM | POA: Insufficient documentation

## 2023-03-01 MED ORDER — DICLOFENAC SODIUM 50 MG PO TBEC: 50.0000 mg | DELAYED_RELEASE_TABLET | Freq: Two times a day (BID) | ORAL | 0 refills | Status: AC

## 2023-03-01 MED ORDER — METHOCARBAMOL 500 MG PO TABS: 500.0000 mg | ORAL_TABLET | Freq: Two times a day (BID) | ORAL | 0 refills | Status: AC

## 2023-03-01 NOTE — ED Provider Notes (Signed)
[place] [facility] Provider Note   CSN: [id number] Arrival date & time: 03/01/23  1030     History  Chief Complaint  Patient presents with   Back Pain    Mark Savage Burman Bruington is a 42 y.o. male.  42 year old male presents with complaint of pain in his back and headaches.  He states he has a history of migraines, diagnosed when he was living in [place] 12 years ago, gets frequent generalized headaches.  He has been taking Excedrin for his headaches and states this helps with the pain for 2 or 3 hours and then the headaches resolved.  He denies recent falls or injuries, fevers.  Also reports generalized back pain ongoing for 3 to 6 months, daily, worse with movement, without fall or injury.  Patient has not been to PCP for these symptoms, states that he was off work today so he came to the ER.  No other complaints or concerns today.  A language interpreter was used Best boy, [place]).  Back Pain      Home Medications Prior to Admission medications   Medication Sig Start Date End Date Taking? Authorizing Provider  diclofenac (VOLTAREN) 50 MG EC tablet Take 1 tablet (50 mg total) by mouth 2 (two) times daily for 10 days. 03/01/23 03/11/23 Yes Jeannie Fend, PA-C  methocarbamol (ROBAXIN) 500 MG tablet Take 1 tablet (500 mg total) by mouth 2 (two) times daily. 03/01/23  Yes Jeannie Fend, PA-C      Allergies    Patient has no known allergies.    Review of Systems   Review of Systems  Musculoskeletal:  Positive for back pain.   Negative except as per HPI Physical Exam Updated Vital Signs BP (!) 150/98   Pulse 86   Temp 97.7 F (36.5 C) (Oral)   Resp 16   Wt 83.9 kg   SpO2 99%   BMI 29.86 kg/m  Physical Exam Vitals and nursing note reviewed.  Constitutional:      General: He is not in acute distress.    Appearance: He is well-developed. He is not diaphoretic.  HENT:     Head: Normocephalic and atraumatic.  Eyes:      Extraocular Movements: Extraocular movements intact.     Pupils: Pupils are equal, round, and reactive to light.  Cardiovascular:     Pulses: Normal pulses.  Pulmonary:     Effort: Pulmonary effort is normal.  Musculoskeletal:     Cervical back: Tenderness present. No bony tenderness.     Thoracic back: Tenderness present. No bony tenderness.     Lumbar back: Tenderness present. No bony tenderness.       Back:  Skin:    General: Skin is warm and dry.  Neurological:     Mental Status: He is alert and oriented to person, place, and time.     Motor: No weakness.     Gait: Gait normal.  Psychiatric:        Behavior: Behavior normal.     ED Results / Procedures / Treatments   Labs (all labs ordered are listed, but only abnormal results are displayed) Labs Reviewed - No data to display  EKG None  Radiology No results found.  Procedures Procedures    Medications Ordered in ED Medications - No data to display  ED Course/ Medical Decision Making/ A&P  Medical Decision Making  42 year old male presents with complaint of back pain for 3 to 6 months without injury.  Found of generalized back pain although no midline tenderness.  No red flags.  Also complaint of headache described as intermittent headaches for greater than 12 years, last seen for migraines when living in [place] 12 years ago.  Patient is well-appearing, nontoxic, no rigidity noted, neuroexam unremarkable.  Is to provide Robaxin and diclofenac, suspect musculoskeletal and muscle spasms are leading to headaches.  Provided with information for PCP follow-up.        Final Clinical Impression(s) / ED Diagnoses Final diagnoses:  Chronic back pain, unspecified back location, unspecified back pain laterality  Nonintractable headache, unspecified chronicity pattern, unspecified headache type    Rx / DC Orders ED Discharge Orders          Ordered    methocarbamol (ROBAXIN) 500  MG tablet  2 times daily        03/01/23 1154    diclofenac (VOLTAREN) 50 MG EC tablet  2 times daily        03/01/23 1154              Jeannie Fend, PA-C 03/01/23 1200    Rolan Bucco, MD 03/01/23 1414

## 2023-03-01 NOTE — ED Triage Notes (Addendum)
Interpreter in use , Spanish language  Upper back pain and right shoulder pain x 3 months ,  Pain is worse radiating to head and unable to work with this pain .

## 2023-03-01 NOTE — ED Notes (Signed)
Pt. Speaks spanish.  Pt. Able to translate to RN that he has mid to upper back pain with neck pain and R shoulder pain for 3 months.

## 2023-03-01 NOTE — Discharge Instructions (Addendum)
Haga un seguimiento con un proveedor de Marine scientist.  He proporcionado informacin para la salud y Counsellor de la comunidad de [employer], llame para programar una cita. Tome Robaxin y diclofenaco segn lo prescrito; Event organiser con el dolor de espalda y el dolor de [place].  No conduzca ni opere maquinaria si est tomando Robaxin.  Please follow-up with a primary care provider.  I have provided information for  community health and wellness, call to schedule appointment. Take Robaxin and diclofenac as prescribed, this should help with your back pain and headache.  Do not drive or operate machinery if you are taking Robaxin.

## 2024-02-17 ENCOUNTER — Other Ambulatory Visit: Payer: Self-pay

## 2024-02-17 ENCOUNTER — Encounter (HOSPITAL_BASED_OUTPATIENT_CLINIC_OR_DEPARTMENT_OTHER): Payer: Self-pay | Admitting: Emergency Medicine

## 2024-02-17 ENCOUNTER — Emergency Department (HOSPITAL_BASED_OUTPATIENT_CLINIC_OR_DEPARTMENT_OTHER)
Admission: EM | Admit: 2024-02-17 | Discharge: 2024-02-17 | Disposition: A | Discharge status: Home / Self Care | Payer: Self-pay

## 2024-02-17 ENCOUNTER — Emergency Department (HOSPITAL_BASED_OUTPATIENT_CLINIC_OR_DEPARTMENT_OTHER): Payer: Self-pay

## 2024-02-17 DIAGNOSIS — J029 Acute pharyngitis, unspecified: Secondary | ICD-10-CM | POA: Insufficient documentation

## 2024-02-17 LAB — CBC WITH DIFFERENTIAL/PLATELET
Abs Immature Granulocytes: 0.02 K/uL (ref 0.00–0.07)
Basophils Absolute: 0 K/uL (ref 0.0–0.1)
Basophils Relative: 0 %
Eosinophils Absolute: 0.2 K/uL (ref 0.0–0.5)
Eosinophils Relative: 2 %
HCT: 44.6 % (ref 39.0–52.0)
Hemoglobin: 16.1 g/dL (ref 13.0–17.0)
Immature Granulocytes: 0 %
Lymphocytes Relative: 35 %
Lymphs Abs: 2.5 K/uL (ref 0.7–4.0)
MCH: 30.1 pg (ref 26.0–34.0)
MCHC: 36.1 g/dL — ABNORMAL HIGH (ref 30.0–36.0)
MCV: 83.5 fL (ref 80.0–100.0)
Monocytes Absolute: 0.5 K/uL (ref 0.1–1.0)
Monocytes Relative: 7 %
Neutro Abs: 4 K/uL (ref 1.7–7.7)
Neutrophils Relative %: 56 %
Platelets: 240 K/uL (ref 150–400)
RBC: 5.34 MIL/uL (ref 4.22–5.81)
RDW: 12.5 % (ref 11.5–15.5)
WBC: 7.2 K/uL (ref 4.0–10.5)
nRBC: 0 % (ref 0.0–0.2)

## 2024-02-17 LAB — BASIC METABOLIC PANEL WITH GFR
Anion gap: 12 (ref 5–15)
BUN: 15 mg/dL (ref 6–20)
CO2: 24 mmol/L (ref 22–32)
Calcium: 8.8 mg/dL — ABNORMAL LOW (ref 8.9–10.3)
Chloride: 102 mmol/L (ref 98–111)
Creatinine, Ser: 1.33 mg/dL — ABNORMAL HIGH (ref 0.61–1.24)
GFR, Estimated: >60 mL/min (ref >60)
Glucose, Bld: 99 mg/dL (ref 70–99)
Potassium: 4.1 mmol/L (ref 3.5–5.1)
Sodium: 138 mmol/L (ref 135–145)

## 2024-02-17 MED ORDER — IOHEXOL 300 MG/ML  SOLN
100.0000 mL | Freq: Once | INTRAMUSCULAR | Status: AC | PRN
  Administered 2024-02-17: 75 mL via INTRAVENOUS

## 2024-02-17 NOTE — ED Notes (Signed)
 Pt alert and oriented X 4 at the time of discharge. RR even and unlabored. No acute distress noted. Pt verbalized understanding of discharge instructions as discussed. Pt ambulatory to lobby at time of discharge.

## 2024-02-17 NOTE — ED Provider Notes (Signed)
 [place] Provider Note   CSN: [id number] Arrival date & time: 02/17/24  1517     Patient presents with: Sore Throat   Mark Savage is a 43 y.o. male with no significant past medical history presents with concern for throat pain that has been ongoing for the past 6 months.  He reports generalized throat pain, not in one particular spot.  No associated fevers, chills.  He denies any difficulties with swallowing foods or liquids.  Denies any weight loss or personal history of malignancy.  He reports concern regarding his thyroid as a family member had thyroid problems.    Sore Throat       Prior to Admission medications   Medication Sig Start Date End Date Taking? Authorizing Provider  methocarbamol  (ROBAXIN ) 500 MG tablet Take 1 tablet (500 mg total) by mouth 2 (two) times daily. 03/01/23   Beverley Leita LABOR, PA-C    Allergies: Patient has no known allergies.    Review of Systems  Constitutional:  Negative for appetite change and fever.  HENT:  Positive for sore throat.     Updated Vital Signs BP (!) 156/104   Pulse 70   Temp 98.3 F (36.8 C)   Resp 18   Ht 5' 6 (1.676 m)   Wt 90.3 kg   SpO2 100%   BMI 32.12 kg/m   Physical Exam Vitals and nursing note reviewed.  Constitutional:      Appearance: Normal appearance.  HENT:     Head: Atraumatic.     Mouth/Throat:     Comments: Posterior oropharynx without erythema or exudate.  Swallowing without difficulty.  No muffled voice.  No peritonsillar abscess. Neck:     Comments: Left sided submandibular lymphadenopathy  I do not appreciate any thyroid nodules or enlargement on exam Cardiovascular:     Rate and Rhythm: Normal rate and regular rhythm.  Pulmonary:     Effort: Pulmonary effort is normal.  Musculoskeletal:     Cervical back: Normal range of motion and neck supple. No rigidity.  Neurological:     General: No focal deficit present.     Mental  Status: He is alert.  Psychiatric:        Mood and Affect: Mood normal.        Behavior: Behavior normal.     (all labs ordered are listed, but only abnormal results are displayed) Labs Reviewed  CBC WITH DIFFERENTIAL/PLATELET - Abnormal; Notable for the following components:      Result Value   MCHC 36.1 (*)    All other components within normal limits  BASIC METABOLIC PANEL WITH GFR - Abnormal; Notable for the following components:   Creatinine, Ser 1.33 (*)    Calcium 8.8 (*)    All other components within normal limits  TSH    EKG: None  Radiology: CT Soft Tissue Neck W Contrast Result Date: 02/17/2024 EXAM: CT NECK WITH CONTRAST 02/17/2024 05:50:00 PM TECHNIQUE: CT of the neck was performed with the administration of 75 mL of iohexol (OMNIPAQUE) 300 MG/ML solution. Multiplanar reformatted images are provided for review. Automated exposure control, iterative reconstruction, and/or weight based adjustment of the mA/kV was utilized to reduce the radiation dose to as low as reasonably achievable. COMPARISON: None available. CLINICAL HISTORY: Throat pain for 6 months, left-sided lymphadenopathy, rule out malignancy. Patient complains of intermittent sore throat for 6-7 months and is concerned due to a family history of thyroid problems. FINDINGS: AERODIGESTIVE  TRACT: No discrete mass. No edema. SALIVARY GLANDS: The parotid and submandibular glands are unremarkable. THYROID: Unremarkable. LYMPH NODES: No suspicious cervical lymphadenopathy. SOFT TISSUES: No mass or fluid collection. BRAIN, ORBITS, SINUSES AND MASTOIDS: No acute abnormality. LUNGS AND MEDIASTINUM: No acute abnormality. BONES: No focal bone abnormality. IMPRESSION: 1. No acute abnormality or discrete neck mass identified. No significant adenopathy. Electronically signed by: Lonni Necessary MD 02/17/2024 06:04 PM EDT RP Workstation: [id number]     Procedures   Medications Ordered in the ED  iohexol (OMNIPAQUE) 300  MG/ML solution 100 mL (75 mLs Intravenous Contrast Given 02/17/24 1737)                                    Medical Decision Making Amount and/or Complexity of Data Reviewed Labs: ordered. Radiology: ordered.  Risk Prescription drug management.     Differential diagnosis includes but is not limited to viral pharyngitis, strep pharyngitis, thyroid nodule, malignancy, esophagitis  ED Course:  Upon initial evaluation, patient is very well-appearing, stable vitals aside from his elevated blood pressure 156/114 upon arrival.  Reporting ongoing throat pain for the past 6 months.  On exam, has left submandibular lymphadenopathy noted.  Posterior oropharynx without erythema, edema, exudate.  No abscesses noted.  He is swallowing without difficulty.  No thyroid nodules noted on exam.  Given the lymphadenopathy noted on exam and chronicity of pain, will obtain CT neck to evaluate for possible malignancy.  Labs Ordered: I Ordered, and personally interpreted labs.  The pertinent results include:   BMP with elevated creatinine at 1.33, calcium slightly low at 8.8 CBC within normal limits TSH pending  Imaging Studies ordered: I ordered imaging studies including CT soft tissue neck I independently visualized the imaging with scope of interpretation limited to determining acute life threatening conditions related to emergency care. Imaging showed  IMPRESSION:  1. No acute abnormality or discrete neck mass identified. No significant  adenopathy.   I agree with the radiologist interpretation  Medications Given: None  Upon re-evaluation, patient remains well-appearing.  I discussed that his CT of the neck did not show any acute abnormalities such as abnormal masses, thyroid nodules.  His lab work is reassuring with CBC within normal limits.  BMP does have a elevated creatinine at 1.33, unsure what patient's baseline is.  GFR still above 60.  TSH is still in process, but doubt any thyroid storm  or emergent thyroid condition given stable vitals.  I doubt any emergent pathology at this time that would require further workup in the emergency room.  Patient is able to swallow without difficulty.  He does not have any uncontrolled pain.  No signs of infection such as fever, tachycardia, or leukocytosis.  This could be acid reflux, esophagitis, or other non-emergent causes of throat pain.  I discussed with patient that this can be worked up further by ENT or his PCP. Feel he is stable and appropriate for discharge home.    Impression: Chronic throat pain  Disposition:  The patient was discharged home with instructions to establish care with a PCP for further routine care and to follow-up on his elevated creatinine.  He states he does not have a PCP, I will include PCP office information in his discharge paperwork so that he can call to schedule an appointment.  Follow-up with ENT regarding his throat pain.  Contact information for ENT was provided.  I discussed with patient that  his blood pressure was elevated today, and he should discuss this with a PCP within the next month as he may need further intervention for his blood pressure.  He is in agreement with this plan. Return precautions given and patient verbalized understanding.    This chart was dictated using voice recognition software, Dragon. Despite the best efforts of this provider to proofread and correct errors, errors may still occur which can change documentation meaning.       Final diagnoses:  Sore throat    ED Discharge Orders     None          Veta Palma, PA-C 02/17/24 1856    Neysa Caron PARAS, DO 02/17/24 2342

## 2024-02-17 NOTE — ED Triage Notes (Signed)
 Pt c/o sore throat intermittently x 6-7 months; he is concerned because he has a family hx of thyroid problems

## 2024-02-17 NOTE — Discharge Instructions (Addendum)
 Su tomografa computarizada no mostr masas anormales, ndulos tiroideos ni ninguna otra anomala. Se desconoce la causa de su dolor de garganta. Sin embargo, existen otras causas que un otorrinolaringlogo puede evaluar con ms detalle. A continuacin, incluyo la informacin de contacto del otorrinolaringlogo. Llame a su consultorio para programar una cita lo antes posible para una evaluacin ms detallada.  Sus hemogramas y customer service manager fueron normales hoy. Su funcin renal, la creatinina, fue ligeramente anormal. Por favor, pida a su mdico de cabecera que contine monitoreando este valor. A continuacin, incluyo informacin sobre nuestro consultorio de atencin primaria en este edificio. Puede llamarlos para programar una cita.  Su presin arterial fue elevada hoy, 156/104. Intente hacer 30 minutos de ejercicio al da y limitar el consumo de comida procesada o rpida. Tmese la presin arterial en casa y regstrela. Tmela a la [employer]. Consulte con su mdico de cabecera dentro del prximo mes para ver si necesita tratamiento para la presin arterial.  Por favor, regrese a la sala de emergencias si tiene dificultad para tragar alimentos, fiebre inexplicable, cualquier empeoramiento severo del dolor, cualquier otro sntoma nuevo o preocupante.  Your CT scan did not show any abnormal masses, thyroid nodules, or other abnormality.  It is unclear as to the cause of your throat pain.  However, there are other causes of throat pain that can be evaluated further by an ear nose and throat doctor.  I have included the contact information for ear nose and throat doctor below.  Please call their office to schedule an appointment as soon as possible for further evaluation.  Your blood counts and electrolytes were normal today.  Your kidney function, creatinine, was slightly abnormal.  Please have your family/primary doctor continue to monitor this value.  I have included information below for  our primary care office here in this building.  You can call them to schedule an appointment.  Your blood pressure was elevated here today at 156/104. Aim to get 30 minutes of exercise daily and limit intake of processed/fast foods. Please take and record your blood pressures at home. Take them at the same time every day. Follow up with your PCP within the next month to discuss if you may need treatment for your blood pressure.  Please return to the emergency room if you have difficulty swallowing food, unexplained fever, any severe worsening of pain, any other new or concerning symptoms

## 2024-02-18 LAB — TSH: TSH: 0.978 u[IU]/mL (ref 0.350–4.500)
